# Patient Record
Sex: Female | Born: 1975 | Race: Black or African American | Hispanic: No | Smoking: Current every day smoker
Health system: Southern US, Community
[De-identification: ages and names within clinical notes are randomized; demographics above are authoritative.]

## PROBLEM LIST (undated history)

## (undated) DIAGNOSIS — G4700 Insomnia, unspecified: Secondary | ICD-10-CM

## (undated) HISTORY — PX: CERVICAL DISC SURGERY: SHX588

## (undated) HISTORY — PX: TONSILLECTOMY: SUR1361

## (undated) HISTORY — PX: APPENDECTOMY: SHX54

## (undated) HISTORY — PX: PARTIAL HYSTERECTOMY: SHX80

---

## 2016-03-26 ENCOUNTER — Emergency Department (HOSPITAL_COMMUNITY)
Admission: EM | Admit: 2016-03-26 | Discharge: 2016-03-26 | Disposition: A | Discharge status: Home / Self Care | Payer: Medicaid Other | Attending: Emergency Medicine | Admitting: Emergency Medicine

## 2016-03-26 ENCOUNTER — Encounter (HOSPITAL_COMMUNITY): Payer: Self-pay | Admitting: Emergency Medicine

## 2016-03-26 ENCOUNTER — Emergency Department (HOSPITAL_COMMUNITY): Payer: Medicaid Other

## 2016-03-26 DIAGNOSIS — F17210 Nicotine dependence, cigarettes, uncomplicated: Secondary | ICD-10-CM | POA: Insufficient documentation

## 2016-03-26 DIAGNOSIS — M25511 Pain in right shoulder: Secondary | ICD-10-CM | POA: Diagnosis present

## 2016-03-26 DIAGNOSIS — G8929 Other chronic pain: Secondary | ICD-10-CM | POA: Insufficient documentation

## 2016-03-26 HISTORY — DX: Insomnia, unspecified: G47.00

## 2016-03-26 MED ORDER — PREDNISONE 10 MG PO TABS: ORAL_TABLET | ORAL | Status: AC

## 2016-03-26 MED ORDER — OXYCODONE-ACETAMINOPHEN 5-325 MG PO TABS
1.0000 | ORAL_TABLET | Freq: Once | ORAL | Status: AC
  Administered 2016-03-26: 1 via ORAL
  Filled 2016-03-26: qty 1

## 2016-03-26 NOTE — Discharge Instructions (Signed)
Heat Therapy Heat therapy can help ease sore, stiff, injured, and tight muscles and joints. Heat relaxes your muscles, which may help ease your pain. Heat therapy should only be used on old, pre-existing, or long-lasting (chronic) injuries. Do not use heat therapy unless told by your doctor. HOW TO USE HEAT THERAPY There are several different kinds of heat therapy, including:  Moist heat pack.  Warm water bath.  Hot water bottle.  Electric heating pad.  Heated gel pack.  Heated wrap.  Electric heating pad. GENERAL HEAT THERAPY RECOMMENDATIONS   Do not sleep while using heat therapy. Only use heat therapy while you are awake.  Your skin may turn pink while using heat therapy. Do not use heat therapy if your skin turns red.  Do not use heat therapy if you have new pain.  High heat or long exposure to heat can cause burns. Be careful when using heat therapy to avoid burning your skin.  Do not use heat therapy on areas of your skin that are already irritated, such as with a rash or sunburn. GET HELP IF:   You have blisters, redness, swelling (puffiness), or numbness.  You have new pain.  Your pain is worse. MAKE SURE YOU:  Understand these instructions.  Will watch your condition.  Will get help right away if you are not doing well or get worse.   This information is not intended to replace advice given to you by your health care provider. Make sure you discuss any questions you have with your health care provider.   Document Released: 02/27/2012 Document Revised: 12/26/2014 Document Reviewed: 01/28/2014 Elsevier Interactive Patient Education 2016 [employer]  Joint Pain Joint pain can be caused by many things. The joint can be bruised, infected, weak from aging, or sore from exercise. The pain will probably go away if you follow your doctor's instructions for home care. If your joint pain continues, more tests may be needed to help find the cause of your  condition. HOME CARE Watch your condition for any changes. Follow these instructions as told to lessen the pain that you are feeling:  Take medicines only as told by your doctor.  Rest the sore joint for as long as told by your doctor. If your doctor tells you to, raise (elevate) the painful joint above the level of your heart while you are sitting or lying down.  Do not do things that cause pain or make the pain worse.  If told, put ice on the painful area:  Put ice in a plastic bag.  Place a towel between your skin and the bag.  Leave the ice on for 20 minutes, 2-3 times per day.  Wear an elastic bandage, splint, or sling as told by your doctor. Loosen the bandage or splint if your fingers or toes lose feeling (become numb) and tingle, or if they turn cold and blue.  Begin exercising or stretching the joint as told by your doctor. Ask your doctor what types of exercise are safe for you.  Keep all follow-up visits as told by your doctor. This is important. GET HELP IF:  Your pain gets worse and medicine does not help it.  Your joint pain does not get better in 3 days.  You have more bruising or swelling.  You have a fever.  You lose 10 pounds (4.5 kg) or more without trying. GET HELP RIGHT AWAY IF:  You are not able to move the joint.  Your fingers or toes become  numb or they turn cold and blue.   This information is not intended to replace advice given to you by your health care provider. Make sure you discuss any questions you have with your health care provider.   Document Released: 11/23/2009 Document Revised: 12/26/2014 Document Reviewed: 09/16/2014 Elsevier Interactive Patient Education [employer]

## 2016-03-26 NOTE — ED Provider Notes (Signed)
CSN: [id number]     Arrival date & time 03/26/16  1801 History   First MD Initiated Contact with Patient 03/26/16 1808     Chief Complaint  Patient presents with  . Shoulder Pain     (Consider location/radiation/quality/duration/timing/severity/associated sxs/prior Treatment) HPI   Megan Solomon is a 40 y.o. female who presents to the Emergency Department complaining of worsening of her right shoulder pain.  She states that she has ongoing pain to the shoulder since a previous injury that resulted in a AC separation.  She has been taking norco for pain relief, but has ran out of her medication.  She also takes voltaren, but pain is not improved.  She states that pain has been worse today and believes that she has re-injured her shoulder.  She also describes a sharp, radiating pain from the right shoulder and into the right forearm and fingers.  Pain is worse with shoulder movement and improves with rest.  She denies neck pain, numbness, fever, headache chest pain, shortness of breath or swelling.     Past Medical History  Diagnosis Date  . Insomnia    Past Surgical History  Procedure Laterality Date  . Cervical disc surgery    . Appendectomy    . Tonsillectomy    . Partial hysterectomy    . Cesarean section     History reviewed. No pertinent family history. Social History  Substance Use Topics  . Smoking status: Current Every Day Smoker -- 0.50 packs/day for 15 years    Types: Cigarettes  . Smokeless tobacco: Never Used  . Alcohol Use: No   OB History    Gravida Para Term Preterm AB TAB SAB Ectopic Multiple Living   Review of Systems  Constitutional: Negative for fever and chills.  Musculoskeletal: Positive for arthralgias (right shoulder pain). Negative for joint swelling and neck pain.  Skin: Negative for color change and wound.  Neurological: Negative for dizziness, weakness, numbness and headaches.  All other systems reviewed and are  negative.     Allergies  Phenergan and Rocephin  Home Medications   Prior to Admission medications   Not on File   BP 146/92 mmHg  Pulse 96  Temp(Src) 98.4 F (36.9 C) (Oral)  Resp 18  Ht  (1.778 m)  Wt 91.899 kg  BMI 29.07 kg/m2  SpO2 100% Physical Exam  Constitutional: She is oriented to person, place, and time. She appears well-developed and well-nourished. No distress.  HENT:  Head: Normocephalic and atraumatic.  Neck: Normal range of motion. Neck supple. No thyromegaly present.  Cardiovascular: Normal rate, regular rhythm and intact distal pulses.   No murmur heard. Pulmonary/Chest: Effort normal and breath sounds normal. No respiratory distress. She exhibits no tenderness.  Musculoskeletal: She exhibits tenderness. She exhibits no edema.  ttp of the [facility] joint of right shoulder.  Pain with abduction of the right arm.  Radial pulse is brisk, distal sensation intact, CR< 2 sec. Grip strength is strong and symmetrical.   No edema , erythema or step-off deformity of the joint.   Lymphadenopathy:    She has no cervical adenopathy.  Neurological: She is alert and oriented to person, place, and time. She has normal strength. No sensory deficit. She exhibits normal muscle tone. Coordination normal.  Reflex Scores:      Tricep reflexes are 1+ on the right side and 2+ on the left side.  Bicep reflexes are 1+ on the right side and 2+ on the left side. Skin: Skin is warm and dry.  Nursing note and vitals reviewed.   ED Course  Procedures (including critical care time) Labs Review Labs Reviewed - No data to display  Imaging Review Dg Shoulder Right  03/26/2016  CLINICAL DATA:  Right shoulder injury with pain, initial encounter EXAM: RIGHT SHOULDER - 2+ VIEW COMPARISON:  None. FINDINGS: Mild glenohumeral degenerative changes are noted. No acute fracture or dislocation is seen. Mild elevation of the distal clavicle is noted consistent with the patient's given clinical  history of AC joint separation. No soft tissue abnormality is seen. The underlying bony thorax is within normal limits. IMPRESSION: Degenerative changes without acute abnormality. Electronically Signed   By: Alcide CleverMark  Lukens M.D.   On: 03/26/2016 18:24   I have personally reviewed and evaluated these images and lab results as part of my medical decision-making.   EKG Interpretation None      MDM   Final diagnoses:  Chronic shoulder pain, right    Pt is scheduled to see orthopedic doctor April 26 and pain management in May.    [facility].  Received total of #120 hydrocodone 7.5/325 mg tabs in March   NV intact.  Pain localized to the right shoulder joint and no concerning sx's for septic joint.  She agrees to f/u with her orthopedic provider as scheduled and appears stable for d/c.  rx for prednisone  Pauline Ausammy Marko Skalski, PA-C 03/27/16 0112  Donnetta HutchingBrian Cook, MD 03/27/16 1801

## 2016-03-26 NOTE — ED Notes (Signed)
Patient c/o right shoulder pain. Per patient has been seen by orthopedic doctor since January after being told it was sperated. Patient is to see a pain clinic but believes she has re-injuried it some how today.

## 2017-03-17 IMAGING — DX DG SHOULDER 2+V*R*
3 series · 3 of 3 positions shown · non-contrast
Comparison: None.

CLINICAL DATA: Right shoulder injury with pain, initial encounter

EXAM:
RIGHT SHOULDER - 2+ VIEW

[shoulder grashey]
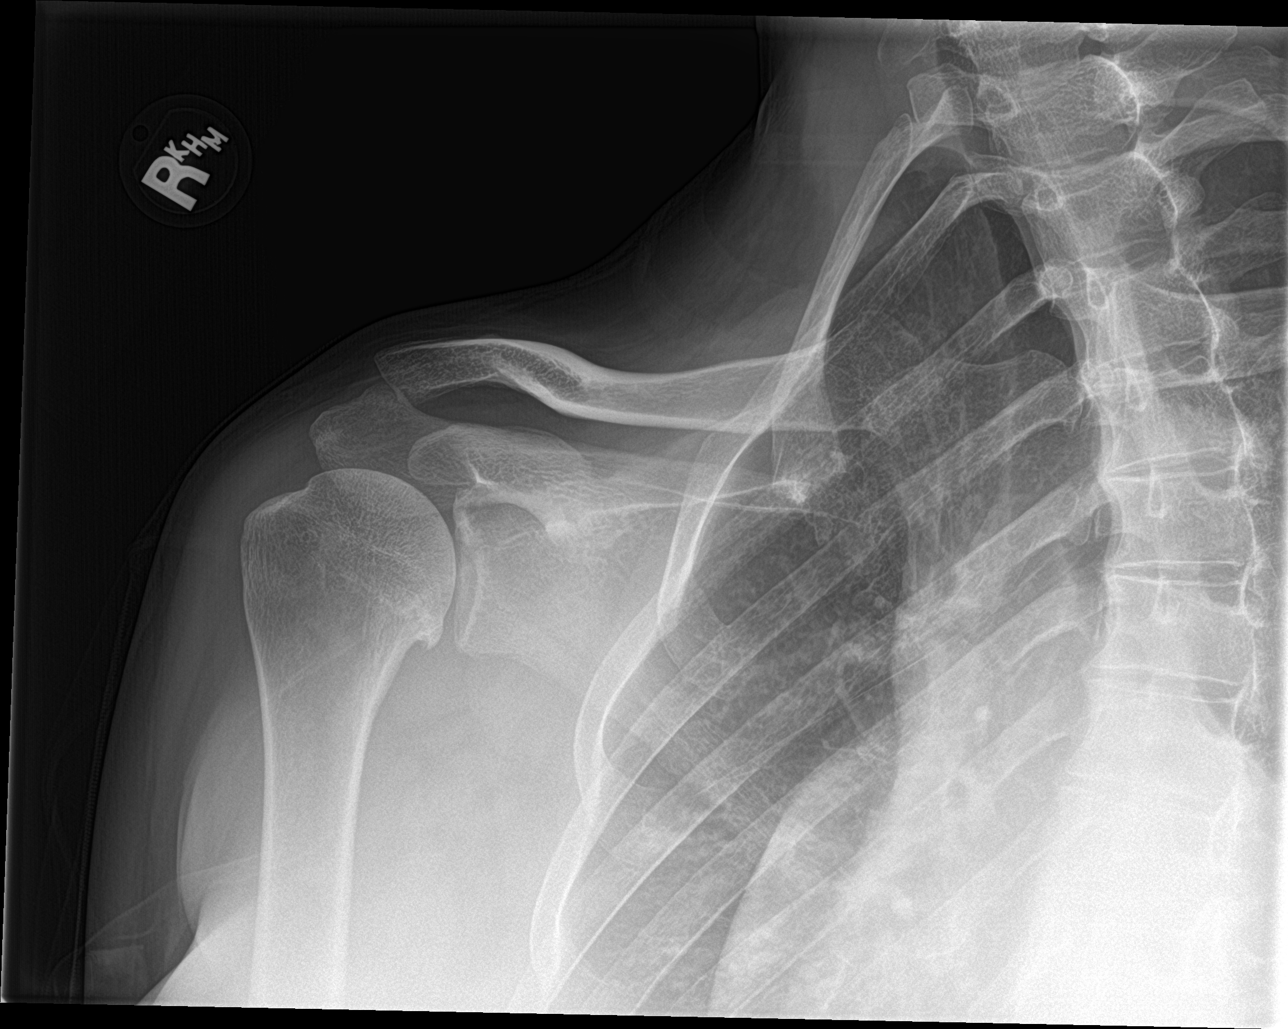

[shoulder y view]
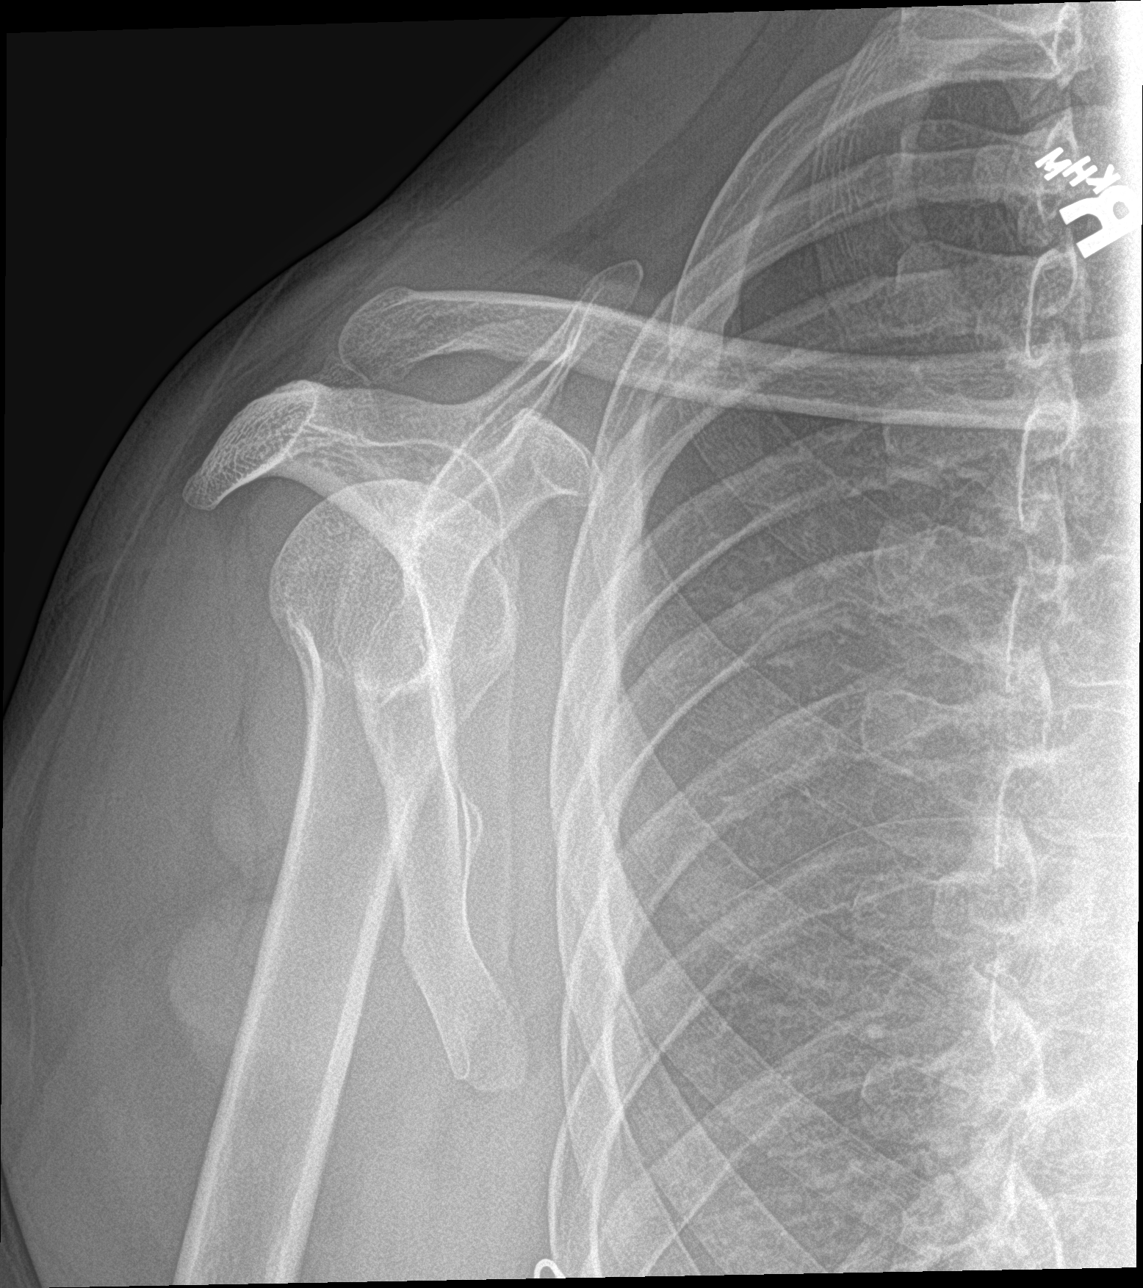

[shoulder axillary]
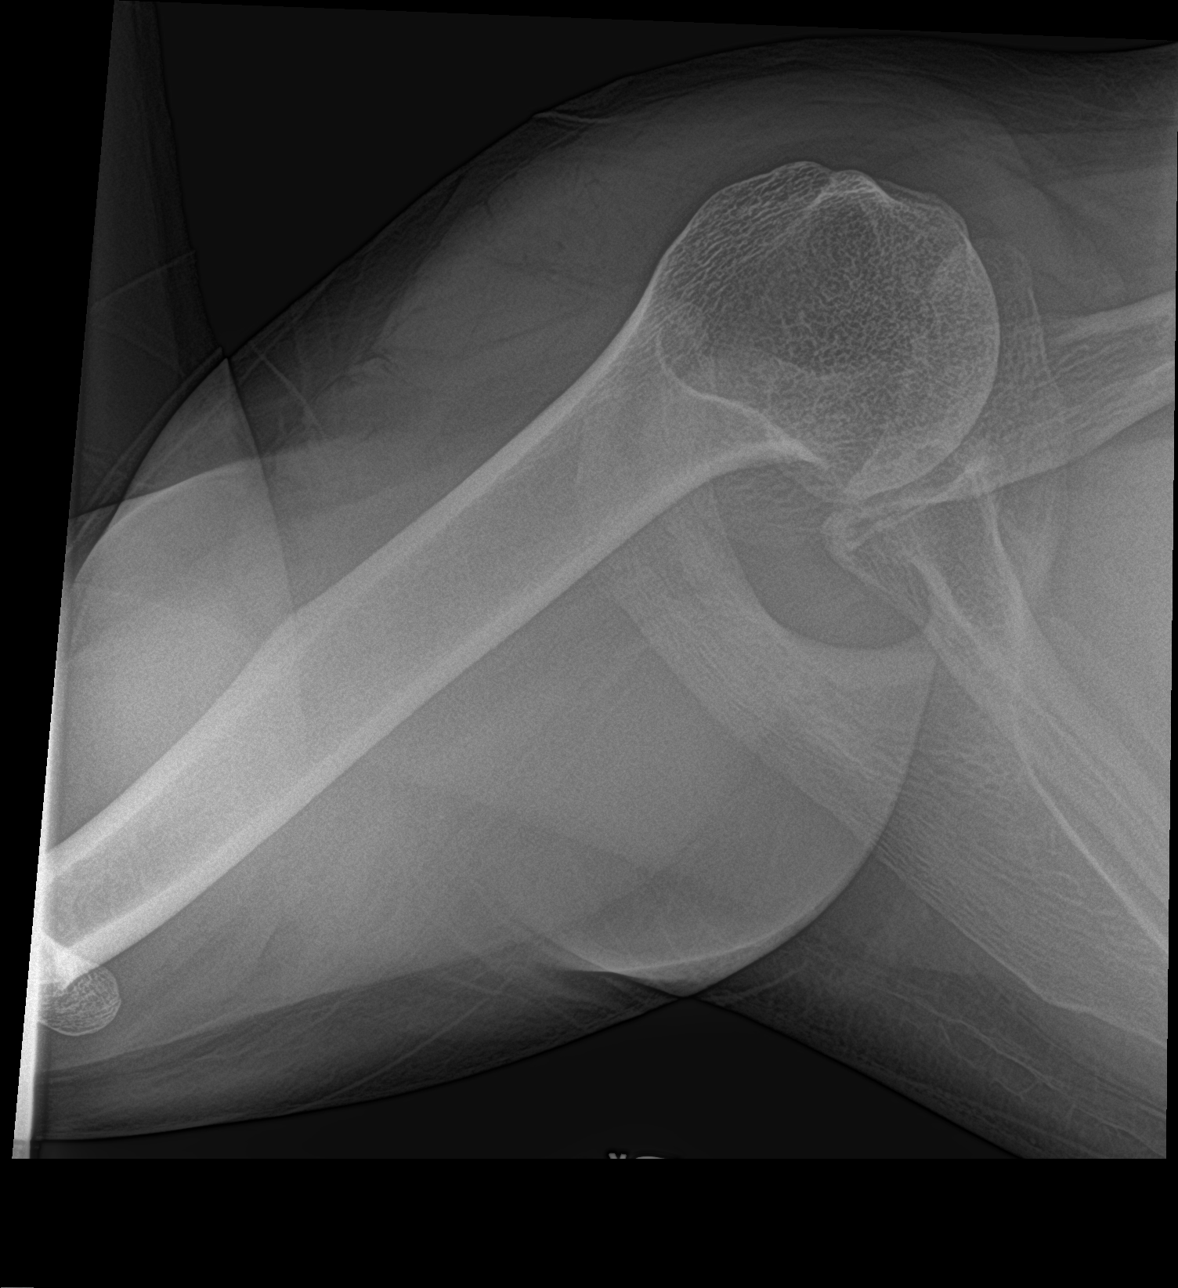

[3 of 3 positions shown; findings below may reference images not displayed]

FINDINGS: Mild glenohumeral degenerative changes are noted. No acute fracture
or dislocation is seen. Mild elevation of the distal clavicle is
noted consistent with the patient's given clinical history of AC
joint separation. No soft tissue abnormality is seen. The underlying
bony thorax is within normal limits.
IMPRESSION: Degenerative changes without acute abnormality.
